# Patient Record
Sex: Male | Born: 1955 | Race: White | Hispanic: No | Marital: Married | Smoking: Current some day smoker
Health system: Southern US, Community
[De-identification: ages and names within clinical notes are randomized; demographics above are authoritative.]

## PROBLEM LIST (undated history)

## (undated) DIAGNOSIS — E559 Vitamin D deficiency, unspecified: Secondary | ICD-10-CM

## (undated) DIAGNOSIS — K219 Gastro-esophageal reflux disease without esophagitis: Secondary | ICD-10-CM

## (undated) DIAGNOSIS — IMO0001 Reserved for inherently not codable concepts without codable children: Secondary | ICD-10-CM

## (undated) HISTORY — PX: OTHER SURGICAL HISTORY: SHX169

## (undated) HISTORY — PX: COLONOSCOPY: SHX174

## (undated) HISTORY — DX: Reserved for inherently not codable concepts without codable children: IMO0001

## (undated) HISTORY — DX: Gastro-esophageal reflux disease without esophagitis: K21.9

## (undated) HISTORY — DX: Vitamin D deficiency, unspecified: E55.9

---

## 2002-10-29 ENCOUNTER — Encounter: Payer: Self-pay | Admitting: Emergency Medicine

## 2002-10-29 ENCOUNTER — Emergency Department (HOSPITAL_COMMUNITY): Admission: EM | Admit: 2002-10-29 | Discharge: 2002-10-29 | Payer: Self-pay | Admitting: Emergency Medicine

## 2009-10-06 ENCOUNTER — Ambulatory Visit (HOSPITAL_COMMUNITY): Admission: RE | Admit: 2009-10-06 | Discharge: 2009-10-06 | Payer: Self-pay | Admitting: Family Medicine

## 2011-06-01 ENCOUNTER — Emergency Department (HOSPITAL_COMMUNITY): Payer: BC Managed Care – PPO

## 2011-06-01 ENCOUNTER — Emergency Department (HOSPITAL_COMMUNITY)
Admission: EM | Admit: 2011-06-01 | Discharge: 2011-06-01 | Disposition: A | Discharge status: Other Acute Inpatient Hospital | Payer: BC Managed Care – PPO | Source: Home / Self Care | Attending: Emergency Medicine | Admitting: Emergency Medicine

## 2011-06-01 ENCOUNTER — Ambulatory Visit (HOSPITAL_COMMUNITY)
Admission: AD | Admit: 2011-06-01 | Discharge: 2011-06-01 | Disposition: A | Discharge status: Home / Self Care | Payer: BC Managed Care – PPO | Source: Ambulatory Visit | Attending: Interventional Cardiology | Admitting: Interventional Cardiology

## 2011-06-01 ENCOUNTER — Ambulatory Visit (HOSPITAL_COMMUNITY): Payer: BC Managed Care – PPO

## 2011-06-01 ENCOUNTER — Encounter (HOSPITAL_COMMUNITY): Payer: Self-pay | Admitting: Radiology

## 2011-06-01 DIAGNOSIS — R0602 Shortness of breath: Secondary | ICD-10-CM | POA: Insufficient documentation

## 2011-06-01 DIAGNOSIS — R079 Chest pain, unspecified: Secondary | ICD-10-CM | POA: Insufficient documentation

## 2011-06-01 DIAGNOSIS — R5381 Other malaise: Secondary | ICD-10-CM | POA: Insufficient documentation

## 2011-06-01 DIAGNOSIS — R5383 Other fatigue: Secondary | ICD-10-CM | POA: Insufficient documentation

## 2011-06-01 LAB — CK TOTAL AND CKMB (NOT AT ARMC)
CK, MB: 2 ng/mL (ref 0.3–4.0)
Relative Index: 1 (ref 0.0–2.5)
Total CK: 208 U/L (ref 7–232)

## 2011-06-01 LAB — DIFFERENTIAL
Basophils Absolute: 0.1 10*3/uL (ref 0.0–0.1)
Basophils Relative: 1 % (ref 0–1)
Eosinophils Absolute: 0.3 10*3/uL (ref 0.0–0.7)
Eosinophils Relative: 4 % (ref 0–5)
Lymphocytes Relative: 34 % (ref 12–46)
Lymphs Abs: 2 10*3/uL (ref 0.7–4.0)
Monocytes Absolute: 0.3 10*3/uL (ref 0.1–1.0)
Monocytes Relative: 6 % (ref 3–12)
Neutro Abs: 3.1 10*3/uL (ref 1.7–7.7)
Neutrophils Relative %: 55 % (ref 43–77)

## 2011-06-01 LAB — CBC
HCT: 44.6 % (ref 39.0–52.0)
Hemoglobin: 15.6 g/dL (ref 13.0–17.0)
MCH: 29.3 pg (ref 26.0–34.0)
MCHC: 35 g/dL (ref 30.0–36.0)
MCV: 83.8 fL (ref 78.0–100.0)
Platelets: 211 10*3/uL (ref 150–400)
RBC: 5.32 MIL/uL (ref 4.22–5.81)
RDW: 13 % (ref 11.5–15.5)
WBC: 5.7 10*3/uL (ref 4.0–10.5)

## 2011-06-01 LAB — BASIC METABOLIC PANEL
BUN: 11 mg/dL (ref 6–23)
CO2: 23 mEq/L (ref 19–32)
Calcium: 9.3 mg/dL (ref 8.4–10.5)
Chloride: 104 mEq/L (ref 96–112)
Creatinine, Ser: 0.89 mg/dL (ref 0.4–1.5)
GFR calc Af Amer: >60 mL/min (ref >60)
GFR calc non Af Amer: >60 mL/min (ref >60)
Glucose, Bld: 118 mg/dL — ABNORMAL HIGH (ref 70–99)
Potassium: 3.7 mEq/L (ref 3.5–5.1)
Sodium: 137 mEq/L (ref 135–145)

## 2011-06-01 LAB — APTT: aPTT: 30 seconds (ref 24–37)

## 2011-06-01 LAB — D-DIMER, QUANTITATIVE: D-Dimer, Quant: 0.86 ug/mL-FEU — ABNORMAL HIGH (ref 0.00–0.48)

## 2011-06-01 LAB — TROPONIN I: Troponin I: <0.3 ng/mL (ref <0.30)

## 2011-06-01 LAB — PROTIME-INR
INR: 1.01 (ref 0.00–1.49)
Prothrombin Time: 13.5 seconds (ref 11.6–15.2)

## 2011-06-01 MED ORDER — IOHEXOL 350 MG/ML SOLN
100.0000 mL | Freq: Once | INTRAVENOUS | Status: AC | PRN
  Administered 2011-06-01: 100 mL via INTRAVENOUS

## 2011-06-02 ENCOUNTER — Inpatient Hospital Stay (HOSPITAL_COMMUNITY)
Admission: EM | Admit: 2011-06-02 | Discharge: 2011-06-03 | DRG: 278 | Disposition: A | Discharge status: Home / Self Care | Payer: BC Managed Care – PPO | Attending: Internal Medicine | Admitting: Internal Medicine

## 2011-06-02 DIAGNOSIS — Z79899 Other long term (current) drug therapy: Secondary | ICD-10-CM

## 2011-06-02 DIAGNOSIS — K219 Gastro-esophageal reflux disease without esophagitis: Secondary | ICD-10-CM | POA: Diagnosis present

## 2011-06-02 DIAGNOSIS — E669 Obesity, unspecified: Secondary | ICD-10-CM | POA: Diagnosis present

## 2011-06-02 DIAGNOSIS — Z7982 Long term (current) use of aspirin: Secondary | ICD-10-CM

## 2011-06-02 DIAGNOSIS — M7989 Other specified soft tissue disorders: Secondary | ICD-10-CM

## 2011-06-02 DIAGNOSIS — L02519 Cutaneous abscess of unspecified hand: Principal | ICD-10-CM | POA: Diagnosis present

## 2011-06-02 LAB — CBC
HCT: 46 % (ref 39.0–52.0)
Hemoglobin: 16.8 g/dL (ref 13.0–17.0)
MCH: 30.8 pg (ref 26.0–34.0)
MCHC: 36.5 g/dL — ABNORMAL HIGH (ref 30.0–36.0)
MCV: 84.2 fL (ref 78.0–100.0)
Platelets: 213 10*3/uL (ref 150–400)
RBC: 5.46 MIL/uL (ref 4.22–5.81)
RDW: 13.1 % (ref 11.5–15.5)
WBC: 11.4 10*3/uL — ABNORMAL HIGH (ref 4.0–10.5)

## 2011-06-02 LAB — URINALYSIS, ROUTINE W REFLEX MICROSCOPIC
Bilirubin Urine: NEGATIVE
Glucose, UA: NEGATIVE mg/dL
Ketones, ur: NEGATIVE mg/dL
Leukocytes, UA: NEGATIVE
Nitrite: NEGATIVE
Protein, ur: NEGATIVE mg/dL
Specific Gravity, Urine: 1.017 (ref 1.005–1.030)
Urobilinogen, UA: 0.2 mg/dL (ref 0.0–1.0)
pH: 6 (ref 5.0–8.0)

## 2011-06-02 LAB — COMPREHENSIVE METABOLIC PANEL
ALT: 19 U/L (ref 0–53)
AST: 17 U/L (ref 0–37)
Albumin: 3.7 g/dL (ref 3.5–5.2)
Alkaline Phosphatase: 63 U/L (ref 39–117)
BUN: 10 mg/dL (ref 6–23)
CO2: 28 mEq/L (ref 19–32)
Calcium: 8.9 mg/dL (ref 8.4–10.5)
Chloride: 102 mEq/L (ref 96–112)
Creatinine, Ser: 1.02 mg/dL (ref 0.4–1.5)
GFR calc Af Amer: >60 mL/min (ref >60)
GFR calc non Af Amer: >60 mL/min (ref >60)
Glucose, Bld: 90 mg/dL (ref 70–99)
Potassium: 3.9 mEq/L (ref 3.5–5.1)
Sodium: 137 mEq/L (ref 135–145)
Total Bilirubin: 0.4 mg/dL (ref 0.3–1.2)
Total Protein: 7.4 g/dL (ref 6.0–8.3)

## 2011-06-02 LAB — DIFFERENTIAL
Basophils Absolute: 0 10*3/uL (ref 0.0–0.1)
Basophils Relative: 0 % (ref 0–1)
Eosinophils Absolute: 0.3 10*3/uL (ref 0.0–0.7)
Eosinophils Relative: 2 % (ref 0–5)
Lymphocytes Relative: 17 % (ref 12–46)
Lymphs Abs: 1.9 10*3/uL (ref 0.7–4.0)
Monocytes Absolute: 0.7 10*3/uL (ref 0.1–1.0)
Monocytes Relative: 6 % (ref 3–12)
Neutro Abs: 8.5 10*3/uL — ABNORMAL HIGH (ref 1.7–7.7)
Neutrophils Relative %: 75 % (ref 43–77)

## 2011-06-02 LAB — PROTIME-INR
INR: 0.94 (ref 0.00–1.49)
Prothrombin Time: 12.8 seconds (ref 11.6–15.2)

## 2011-06-02 LAB — URINE MICROSCOPIC-ADD ON

## 2011-06-02 LAB — SEDIMENTATION RATE: Sed Rate: 3 mm/hr (ref 0–16)

## 2011-06-03 LAB — DIFFERENTIAL
Basophils Absolute: 0 10*3/uL (ref 0.0–0.1)
Basophils Relative: 1 % (ref 0–1)
Eosinophils Absolute: 0.3 10*3/uL (ref 0.0–0.7)
Eosinophils Relative: 4 % (ref 0–5)
Lymphocytes Relative: 23 % (ref 12–46)
Lymphs Abs: 1.5 10*3/uL (ref 0.7–4.0)
Monocytes Absolute: 0.4 10*3/uL (ref 0.1–1.0)
Monocytes Relative: 6 % (ref 3–12)
Neutro Abs: 4.3 10*3/uL (ref 1.7–7.7)
Neutrophils Relative %: 67 % (ref 43–77)

## 2011-06-03 LAB — BASIC METABOLIC PANEL
BUN: 12 mg/dL (ref 6–23)
CO2: 26 mEq/L (ref 19–32)
Calcium: 8.2 mg/dL — ABNORMAL LOW (ref 8.4–10.5)
Chloride: 106 mEq/L (ref 96–112)
Creatinine, Ser: 0.82 mg/dL (ref 0.4–1.5)
GFR calc Af Amer: >60 mL/min (ref >60)
GFR calc non Af Amer: >60 mL/min (ref >60)
Glucose, Bld: 93 mg/dL (ref 70–99)
Potassium: 3.6 mEq/L (ref 3.5–5.1)
Sodium: 140 mEq/L (ref 135–145)

## 2011-06-03 LAB — CBC
HCT: 40.2 % (ref 39.0–52.0)
Hemoglobin: 14.3 g/dL (ref 13.0–17.0)
MCH: 30 pg (ref 26.0–34.0)
MCHC: 35.6 g/dL (ref 30.0–36.0)
MCV: 84.3 fL (ref 78.0–100.0)
Platelets: 198 10*3/uL (ref 150–400)
RBC: 4.77 MIL/uL (ref 4.22–5.81)
RDW: 13.1 % (ref 11.5–15.5)
WBC: 6.5 10*3/uL (ref 4.0–10.5)

## 2011-06-07 NOTE — Discharge Summary (Signed)
NAME:  Shane Nolan, Shane Nolan NO.:  [id number]  MEDICAL RECORD NO.:  [id number]  LOCATION:  5523                         FACILITY:  [facility]  PHYSICIAN:  Thad Ranger, MD       DATE OF BIRTH:  11-01-1956  DATE OF ADMISSION:  06/02/2011 DATE OF DISCHARGE:                        DISCHARGE SUMMARY - REFERRING   PRIMARY CARE PHYSICIAN:  Shane Raider, MD  DISCHARGE DIAGNOSES: 1. Cellulitis of the left hand, improving. 2. Atypical chest pain with negative cardiac catheterization on June 01, 2011. 3. Obesity.  DISCHARGE MEDICATIONS: 1. Doxycycline 100 mg p.o. b.i.d. for 10 days. 2. Aspirin 81 mg p.o. daily. 3. Prilosec 10 mg p.o. daily.  BRIEF HISTORY OF PRESENT ILLNESS:  At the time of admission, Shane Nolan is a 55 year old male, who presented to the emergency department secondary to worsening hand swelling which began in the morning of the admission.  He reported also having streaking of redness into the mid forearm area.  There is also pain associated with the swelling and redness.  The patient had a cardiac catheterization performed a day before secondary to complaints of his recurrent chest pain during the past week, results of which were reported as being normal.  The patient had cannulation through the right radial artery, however, the left hand was swollen and tender in the mid hand area.  The patient had an IV placed in the dorsum of that hand.  The patient was seen in the emergency department by Shane Nolan and per the ER records and admitting note, the patient had an ultrasound performed on the left hand, the results of which were negative for any evidence of phlebitis or thrombosis or abscess.  Soft tissue edema was seen.  The patient was placed on IV antibiotic therapy of clindamycin and referred for medical admission.  PERTINENT LAB AND DIAGNOSTIC DATA:  White count at the time of admission on June 02, 2011, was 11.4, hemoglobin 16.8,  hematocrit 46.0, platelets 213.  CMP was essentially unremarkable.  UA negative for any UTI.  ESR 3 within normal limits.  CBC at the time of the dictation, white count 6.5, hemoglobin 14.3, hematocrit 40.2, platelets 198.  BRIEF HOSPITALIZATION COURSE:  Shane Nolan is a 55 year old male, who was admitted to medical service for cellulitis of the left hand. 1. Cellulitis of the left hand.  The patient was placed on IV     antibiotic therapy of clindamycin.  The hand was also elevated     above the heart to reduce the edema.  The patient was closely     monitored.  At the time of the dictation, cellulitis of the left     hand has considerably improved.  Per the patient's pictures of his     hand on the phone, he had significant erythema with streaking and     swelling, which has improved with IV antibiotic.  During the     hospitalization, the IV was placed on the right hand which also     appeared to be infiltrated, hence the IV was taken out and the     patient was started on p.o. clindamycin.  At this point,  the     patient does not want to stay in the hospital and wants to continue     with antibiotic course outpatient.  During the exam, the range of     motion is slightly decreased, however, per the patient he had     marked improvement from yesterday.  There is no streaking at this     point.  The patient was strongly counseled to see Dr. Lupita Nolan within next 3 days, preferably tomorrow for the followup.  I     briefly discussed over the phone with Infectious Disease, Dr.     Ninetta Nolan who recommended doxycycline for 10 days.  The patient was     also strongly counseled to return back to the ER immediately if he     has any worsening of the swelling or redness, pain or any streaking     on either of the hand.  PHYSICAL EXAMINATION:  VITAL SIGNS:  At the time of my dictation, BP 120/75, temperature 98.0, pulse 64, respirations 17, O2 sats 93% on room air. GENERAL:  The  patient is alert, awake and oriented x3, not in acute distress. HEENT:  Anicteric sclerae.  Pink conjunctivae.  Pupils reactive to light and accommodation.  EOMI. NECK:  Supple.  No lymphopathy.  No JVD. CVS:  S1 and S2 clear. CHEST:  Clear to auscultation bilaterally. ABDOMEN:  Soft, nontender, nondistended. EXTREMITIES:  The patient still has swelling in the left hand, which has considerably improved from yesterday comparing with the pictures on his phone.  He now has some edema in his right hand as well due to the IV infiltration.  However, there is no redness or streaking noted in either of the hand.  The patient also has slight edema in the right foot, 1+. NEURO:  No focal neurological deficits noted.  DISCHARGE INSTRUCTIONS:  Discharge followup with Dr. Lupita Nolan within next 3 days, preferably tomorrow.  This was strongly counseled to the patient and his wife present in the room.  The patient was also instructed to return to the ER immediately if he has any worsening of the swelling, pain, redness, or streaking in either of his hand.   As mentioned above, the patient at this point does not want to stay in the hospital, although I did advise him to stay for observation until a.m.  Time spent 35 minutes counseling the patient as well as the discharge.     Thad Ranger, MD     RR/MEDQ  D:  06/03/2011  T:  06/03/2011  Job:  [id number]  cc:   Shane Nolan, M.D.  Electronically Signed by Andres Labrum Vivek Grealish  on 06/07/2011 03:33:25 PM

## 2011-06-09 LAB — CULTURE, BLOOD (ROUTINE X 2)
Culture  Setup Time: 201206100107
Culture  Setup Time: 201206100108
Culture: NO GROWTH
Culture: NO GROWTH

## 2011-06-13 NOTE — H&P (Addendum)
NAME:  Shane Nolan, TIFFANY NO.:  [id number]  MEDICAL RECORD NO.:  [id number]  LOCATION:  [facility]LED                         FACILITY:  [facility]  PHYSICIAN:  Corky Crafts, MDDATE OF BIRTH:  1956-11-23  DATE OF ADMISSION:  06/01/2011 DATE OF DISCHARGE:                             HISTORY & PHYSICAL   PRIMARY CARE PHYSICIAN:  Lupita Raider, M.D.  PRIMARY CARDIOLOGIST:  Armanda Magic, M.D.  REASON FOR ADMISSION:  Chest discomfort with diaphoresis.  HISTORY OF PRESENT ILLNESS:  The patient is a 55 year old who has had some intermittent chest discomfort over the past 1 to 2 weeks.  He has had some exertional shortness of breath and fatigue that is worse with walking up steps and better with rest.  He saw Dr. Mayford Knife yesterday and was scheduled for a stress test.  While at work today he was sitting at his desk, he had severe sweating and tightness in his chest.  This lasted 15 to 20 minutes.  It was not getting better, so he came to the hospital.  He received some nitroglycerin and has had some relief. Currently he is not having any discomfort.  He feels better.  PAST MEDICAL HISTORY:  GERD.  ALLERGIES:  No known drug allergies.  MEDICATIONS AT HOME:  Aspirin 81 mg daily, Prilosec 10 mg daily.  PAST SURGICAL HISTORY:  Cholecystectomy in 1996.  FAMILY HISTORY:  Father died at age 85.  No early coronary artery disease in the family.  He has one sibling who has had several types of cancer.  SOCIAL HISTORY:  Occasionally smokes cigars, rarely alcohol.  He does drink coffee daily.  Firefighter.  REVIEW OF SYSTEMS:  Significant chest discomfort, shortness of breath, fatigue and sweating as noted above.  No bleeding problems.  No focal weakness.  All other systems negative.  PHYSICAL EXAMINATION:  VITAL SIGNS:  Blood pressure 122/81, heart rate 73. GENERAL:  In general, he is awake, alert, in no apparent distress. HEENT:  Head is normocephalic,  atraumatic. EYES:  Extraocular movements intact. NECK:  No JVD. CARDIOVASCULAR:  Regular rate and rhythm.  S1, S2. LUNGS:  Clear to auscultation bilaterally. ABDOMEN:  Obese. EXTREMITIES:  Showed no edema, 2+ right radial pulse. NEURO:  No focal motor or sensory deficits. SKIN:  No rash. BACK:  No kyphosis. PSYCH:  Normal mood and affect.  LABORATORY DATA:  Lab work shows troponin negative.  ECG shows normal sinus rhythm with no ST-segment changes.  Chest x-ray shows mild cardiomegaly, no active lung disease.  ASSESSMENT:  A 55 year old with symptoms concerning for unstable angina.  PLAN: 1. Continue aspirin.  We discussed the risks and benefits of cardiac     catheterization.  He is willing to proceed.  All questions were     answered.  His wife was present as well. 2. He does need to lose weight for risk factor modification, he also     need to stop smoking cigars 3. Also check D-dimer to rule out any thromboembolic phenomenon.     Corky Crafts, MD     JSV/MEDQ  D:  06/01/2011  T:  06/01/2011  Job:  [id number]  Electronically Signed by Lance Muss  MD on 06/13/2011 12:27:04 PM

## 2011-06-13 NOTE — Cardiovascular Report (Signed)
NAME:  HARGIS, VANDYNE NO.:  [id number]  MEDICAL RECORD NO.:  [id number]  LOCATION:  6531                         FACILITY:  [facility]  PHYSICIAN:  Corky Crafts, MDDATE OF BIRTH:  Aug 27, 1956  DATE OF PROCEDURE:  06/01/2011 DATE OF DISCHARGE:  06/01/2011                           CARDIAC CATHETERIZATION   PRIMARY PHYSICIAN:  Lupita Raider, MD  PRIMARY CARDIOLOGIST:  Armanda Magic, MD  INDICATION FOR PROCEDURE:  Unstable angina.  PROCEDURES PERFORMED:  Left heart catheterization, left ventriculogram, coronary angiogram.  PROCEDURE NARRATIVE:  The risks and benefits of cardiac catheterization were explained to the patient and informed consent was obtained.  He was brought to the cath lab.  He was prepped and draped in the usual sterile fashion.  His right wrist was infiltrated with 1% lidocaine.  A 5-French glide sheath was placed into the right radial artery using the modified Seldinger technique.  A Versacore wire and JR-4 catheter was used to navigate up the right radial into the ascending aorta.  Heparin was given for anticoagulation.  JR-4 was used to engage the right coronary artery.  Subsequently, a JL-3.5 catheter was used to engage the left main and digital angiography was performed in multiple projections using hand injection of contrast.  A pigtail catheter was advanced to the ascending aorta and across the aortic valve under fluoroscopic guidance. Power injection of contrast was performed in the RAO projection to image the left ventricle.  Catheter was pulled back under continuous hemodynamic pressure monitoring.  The catheter was removed and the sheath was pulled.  A TR band was used for hemostasis.  FINDINGS: 1. The right coronary artery is a large dominant vessel.  There are     only minimal irregularities.  There is no significant coronary     artery disease.  The posterior descending artery is a medium-sized     vessel.  The  posterolateral artery is a large vessel without     significant disease. 2. The left main is a large vessel which is widely patent. 3. The left circumflex is a large vessel proximally.  There is a large     OM-1.  The remainder of the circumflex is a small vessel.  The     majority of the lateral wall seems to be supplied by the right     coronary artery branches. 4. The left anterior descending is a large vessel which wraps around     the apex.  There is no angiographically apparent disease proximally     in the midportion.  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     The distal LAD is widely patent.  There is a branching first     diagonal which extends over the lateral wall which appears widely     patent. 5. Left ventriculogram shows normal left ventricular function with an     estimated ejection fraction of 60%.  The ascending aorta appears     normal.  HEMODYNAMICS:  Left ventricular pressure 113/7 with an LVEDP of 11, aortic pressure 112/73 with a mean aortic pressure of 93 mmHg.  IMPRESSION: 1. No significant coronary artery disease. 2. Normal left ventricular function with an  estimated ejection     fraction of 60%. 3. Normal hemodynamics.  RECOMMENDATIONS:  Continue aggressive preventive therapy and risk factor modification, possible discharge later today if his D-dimer is normal.     Corky Crafts, MD     JSV/MEDQ  D:  06/01/2011  T:  06/02/2011  Job:  [id number]  Electronically Signed by Lance Muss MD on 06/13/2011 12:27:16 PM

## 2011-07-16 NOTE — H&P (Signed)
NAME:  Shane Nolan, LUNDEEN NO.:  [id number]  MEDICAL RECORD NO.:  [id number]  LOCATION:  [facility]                         FACILITY:  [facility]  PHYSICIAN:  Della Goo, M.D. DATE OF BIRTH:  1956/08/30  DATE OF ADMISSION:  06/02/2011 DATE OF DISCHARGE:                             HISTORY & PHYSICAL   DATE OF ADMISSION:  June 02, 2011.  PRIMARY CARE PHYSICIAN:  Lupita Raider, MD, [facility].  CHIEF COMPLAINT:  Left hand swelling.  HISTORY OF PRESENT ILLNESS:  This is a 55 year old male who presents to the emergency department secondary to worsening hand swelling which began in the a.m.  He reports also began to have streaking of redness into the mid forearm area.  There is also pain associated with the swelling and redness.  The patient had a cardiac catheterization performed yesterday secondary to complaints of recurrent chest pain during the past week.  Results of which were reported as being normal.The patient had cannulation through the right radial artery.  However, the left hand is swollen and tender in the mid hand area.  The patient had an IV placed in the dorsum of that hand.  The patient was seen in the emergency department and ultrasound was performed of the left hand, results of which were negative for any evidence of phlebitis or thrombosis.  Soft tissue edema was seen.  There was no evidence of a localized abscess.  The patient was placed on IV antibiotic therapy of clindamycin and referred for medical admission.  PAST MEDICAL HISTORY:  Significant for atypical chest pain.  PAST SURGICAL HISTORY:  History of a cholecystectomy.  MEDICATIONS:  At this time include aspirin and Prilosec.  ALLERGIES:  No known drug allergies.  SOCIAL HISTORY:  The patient is married.  He smokes cigars once a week. He drinks alcohol, Ree Kida Daniel's 1-2 drinks a week.  FAMILY HISTORY:  Significant for longevity in his father's family, they live into their 35s and  his mother died at age 56.  No coronary artery disease, hypertension, diabetes, or cancer in his family that he knows of.  REVIEW OF SYSTEMS:  Pertinents as mentioned above.  PHYSICAL EXAMINATION FINDINGS:  GENERAL:  This is an obese 55 year old Caucasian male who is in mild discomfort, but no acute distress. VITAL SIGNS:  Temperature 98.7, blood pressure 145/91, heart rate 73, respirations 20, O2 sat 97%. HEENT:  Normocephalic, atraumatic.  Pupils equally round, reactive to light.  Extraocular movements intact.  Funduscopic benign.  There is no scleral icterus.  Nares are patent bilaterally.  Oropharynx is clear. NECK:  Supple.  Full range of motion.  No thyromegaly, adenopathy, jugular venous distention. CARDIOVASCULAR:  Regular rate and rhythm.  Normal S1 and S2.  No murmurs, gallops, or rubs appreciated. LUNGS:  Clear to auscultation bilaterally.  No rales, rhonchi, or wheezes. ABDOMEN:  Positive bowel sounds, soft, nontender, nondistended.  No hepatosplenomegaly.  Obese. EXTREMITIES:  Without cyanosis, clubbing, or edema except in the left hand which has about 2-3+ edema.  There is mild edema in the mid forearm area and there is also streaking redness.  The points of tenderness in the left hand are in the mid area of the  hand right at the previous IV site.  The radial and ulnar pulses are intact.  There is redness streaking from the thenar aspect of the left hand into the mid forearm. The patient also has slight edema in the right foot 1+. NEUROLOGIC:  Nonfocal.  LABORATORY STUDIES:  White blood cell count 11.4, hemoglobin 16.8, hematocrit 46.0, MCV 84.2, platelets 213, neutrophils 75%, lymphocytes 17%.  Sodium 137, potassium 3.9, chloride 102, carbon dioxide 28, BUN 10, creatinine 1.02, and glucose 90.  ProTime 12.8, INR 0.94. Urinalysis, trace hemoglobin.  Urine microscopic with 0-2 red blood cells.  ASSESSMENT:  A 55 year old male being admitted with: 1. Cellulitis of  left hand. 2. Atypical chest pain with a negative cardiac catheterization. 3. Gastroesophageal reflux disease. 4. Obesity.  PLAN:  The patient will be admitted to Medical Service bed.  He will continue on IV antibiotic therapy of clindamycin at this time.  The hand will also be elevated above the heart to help reduce the edema and the patient will be monitored for further changes and development of a possible compartment syndrome.  The patient's regular medications will be further verified and the patient will be placed on DVT prophylaxis. The patient is a full code.  Further workup will ensue pending results of the patient's clinical course.     Della Goo, M.D.     HJ/MEDQ  D:  06/02/2011  T:  06/02/2011  Job:  [id number]  Electronically Signed by Della Goo M.D. on 07/16/2011 10:58:03 AM

## 2012-05-01 IMAGING — CT CT ANGIO CHEST
2 of 6 series · 19 of 46 positions shown · IV contrast (APPLIED)
Comparison: None

CLINICAL DATA: Chest pain and elevated D-dimer.

CT ANGIOGRAPHY CHEST WITH CONTRAST
TECHNIQUE: Multidetector CT imaging of the chest was performed
using the standard protocol during bolus administration of
intravenous contrast.  Multiplanar CT image reconstructions
including MIPs were obtained to evaluate the vascular anatomy.
Contrast:  100 ml intravenous Omnipaque 350

[Series 8: pulm embolism 1.0 b25f thin · axial · 0.82mm/px · z∈[+1144,+1417]mm · 16 of 301 slices shown]
[im 14/301  lung]
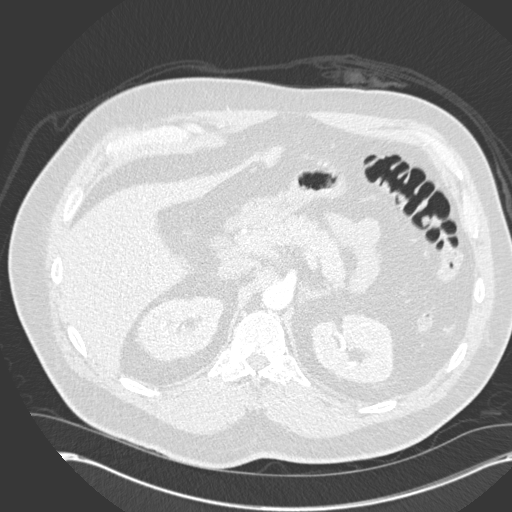
[im 40/301  soft-tissue]
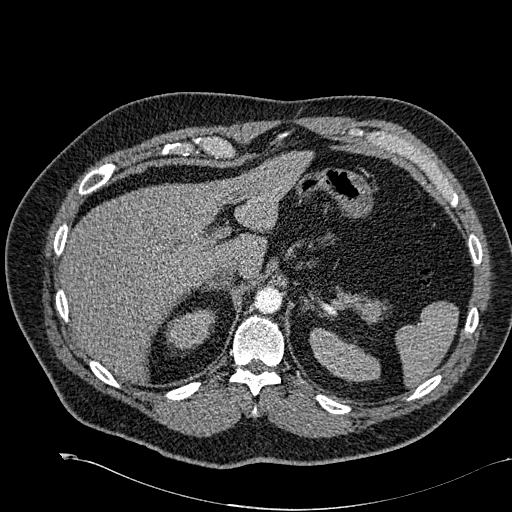
[im 53/301  lung]
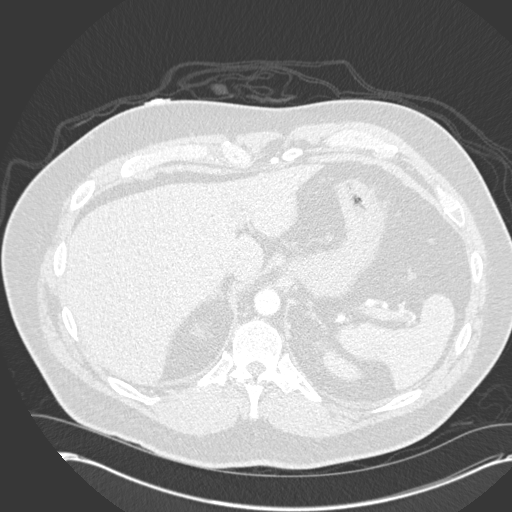
[im 66/301  soft-tissue]
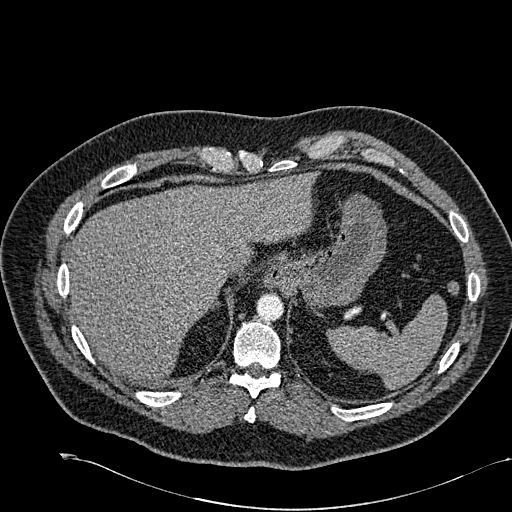
[im 92/301  lung]
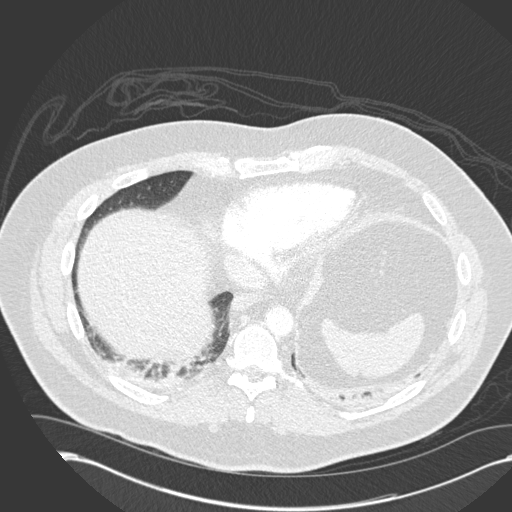
[im 105/301  soft-tissue]
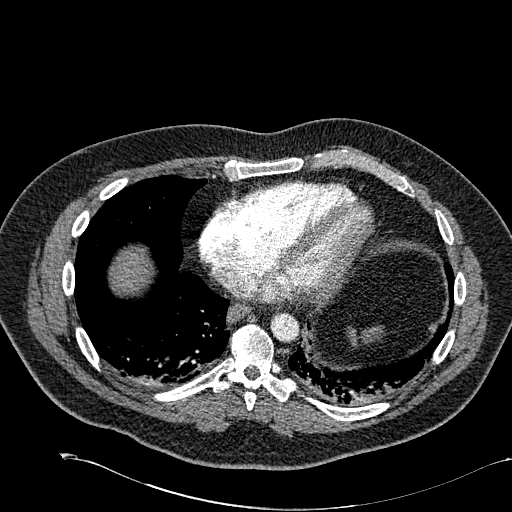
[im 118/301  lung]
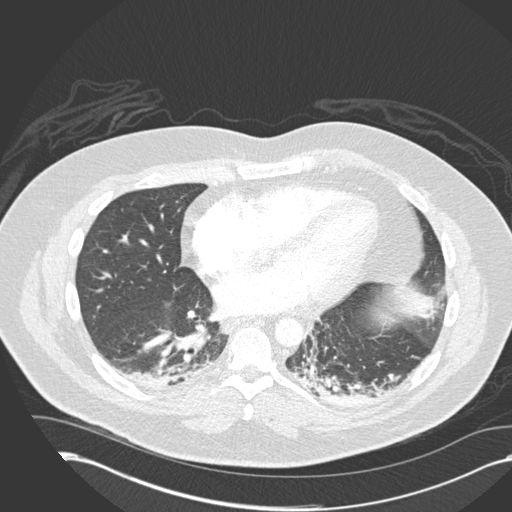
[im 144/301  soft-tissue]
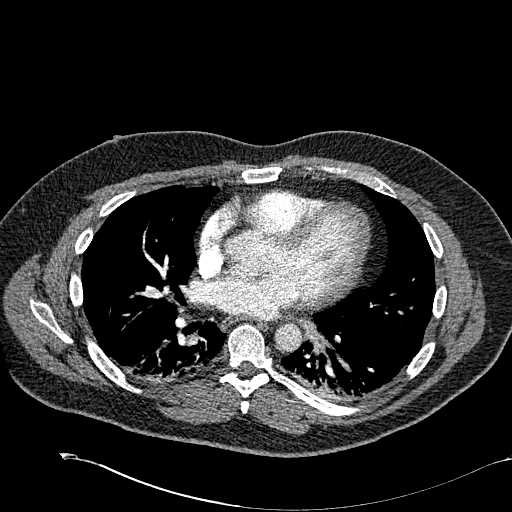
[im 157/301  lung]
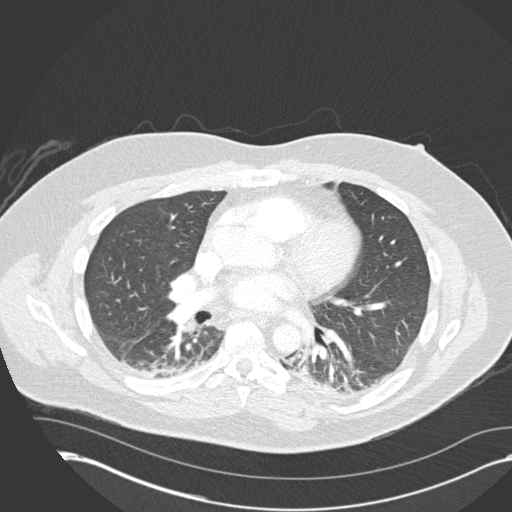
[im 183/301  soft-tissue]
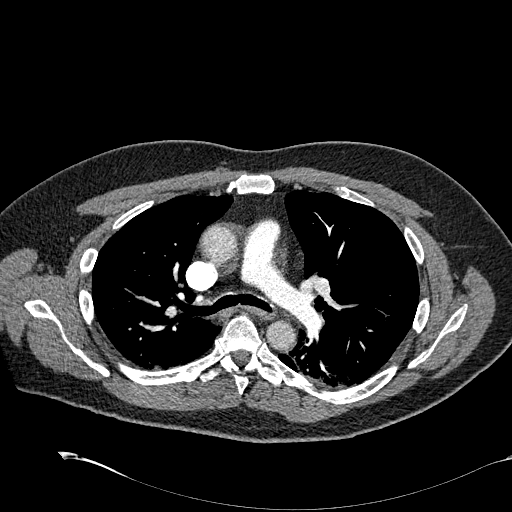
[im 196/301  lung]
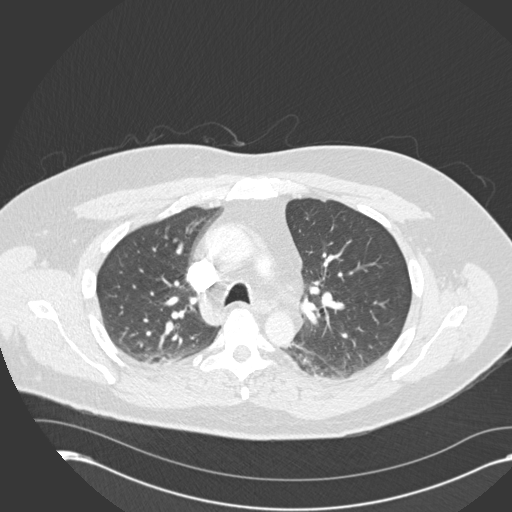
[im 209/301  soft-tissue]
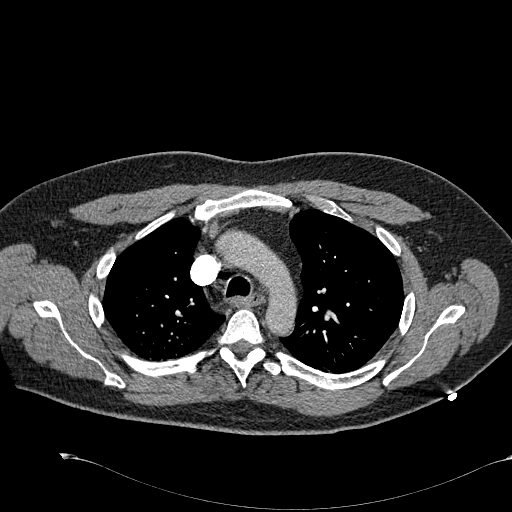
[im 235/301  lung]
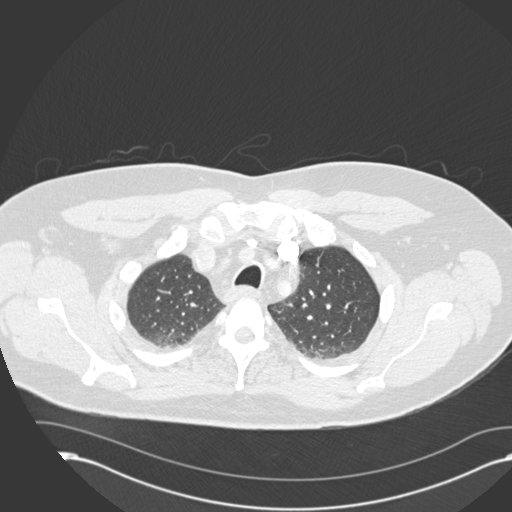
[im 248/301  soft-tissue]
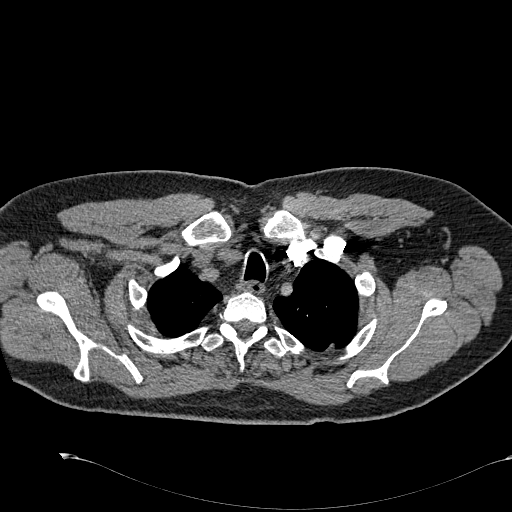
[im 261/301  lung]
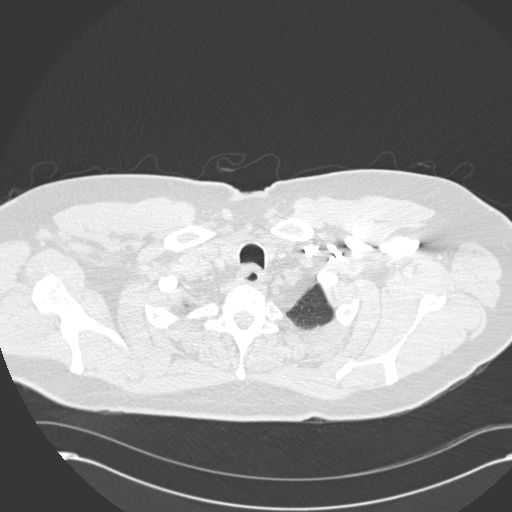
[im 287/301  soft-tissue]
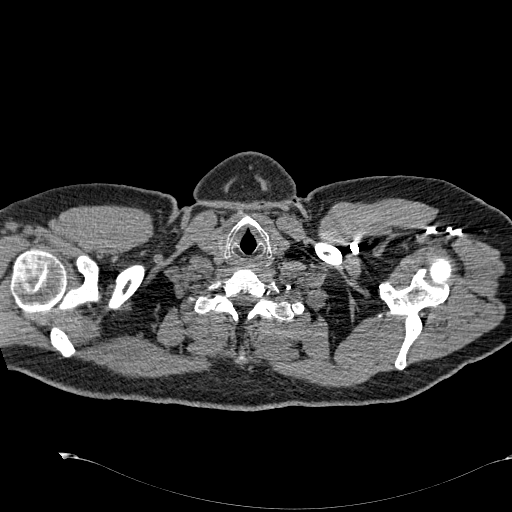

[Series 604: coronal · coronal · 0.82mm/px · 3 of 126 slices shown]
[im 32/126  soft-tissue]
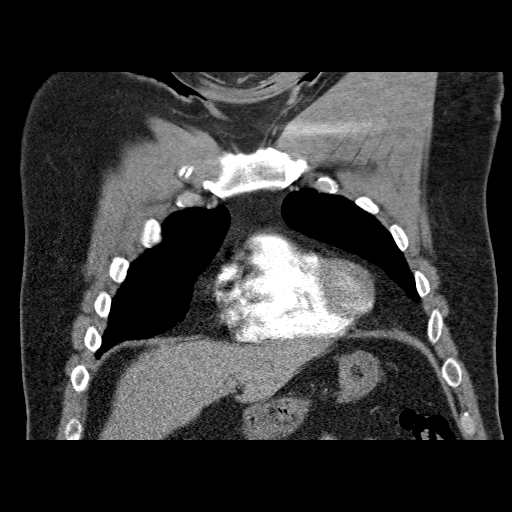
[im 63/126  soft-tissue]
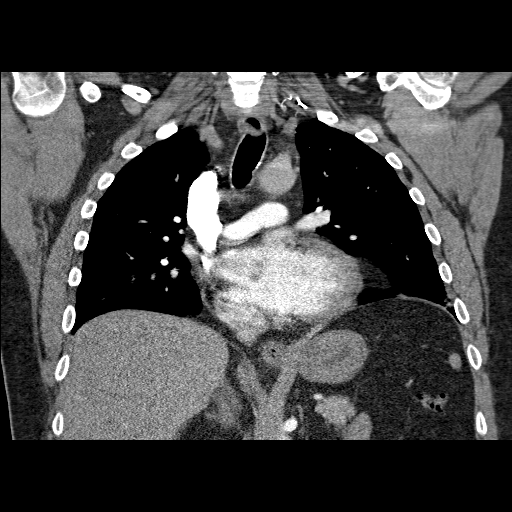
[im 94/126  soft-tissue]
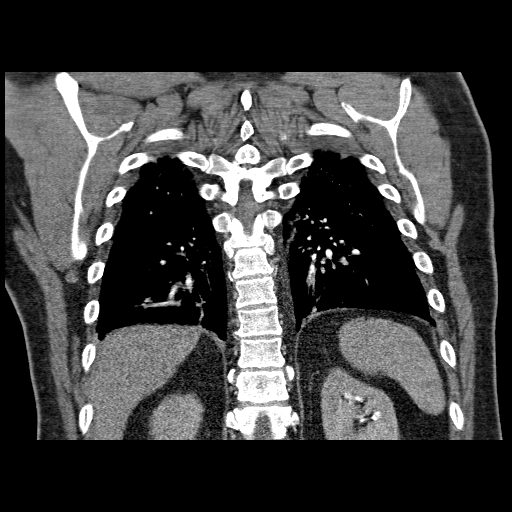

[19 of 46 positions shown; findings below may reference images not displayed]

FINDINGS: This is a technically adequate study but motion artifact
in the lower lungs slightly limit sensitivity.

No pulmonary emboli are identified.
There is no evidence of thoracic aortic aneurysm.
Mild cardiomegaly is identified.
There are no pleural or pericardial effusions.
No enlarged lymph nodes identified in the axillary, mediastinal or
hilar regions.

Moderate atelectasis within both lower lobes noted.
There is no evidence of airspace disease, nodule, mass or
endobronchial/endotracheal lesions.
No acute or suspicious bony abnormalities are identified.

Review of the MIP images confirms the above findings.
IMPRESSION: No evidence of pulmonary emboli or thoracic aortic aneurysm.

Moderate bilateral lower lobe atelectasis.

Mild cardiomegaly

## 2014-01-23 ENCOUNTER — Encounter: Payer: Self-pay | Admitting: Cardiology

## 2016-05-29 DIAGNOSIS — E559 Vitamin D deficiency, unspecified: Secondary | ICD-10-CM | POA: Diagnosis not present

## 2016-05-29 DIAGNOSIS — Z1211 Encounter for screening for malignant neoplasm of colon: Secondary | ICD-10-CM | POA: Diagnosis not present

## 2016-05-29 DIAGNOSIS — Z23 Encounter for immunization: Secondary | ICD-10-CM | POA: Diagnosis not present

## 2016-05-29 DIAGNOSIS — E669 Obesity, unspecified: Secondary | ICD-10-CM | POA: Diagnosis not present

## 2016-05-29 DIAGNOSIS — Z0000 Encounter for general adult medical examination without abnormal findings: Secondary | ICD-10-CM | POA: Diagnosis not present

## 2016-05-29 DIAGNOSIS — Z125 Encounter for screening for malignant neoplasm of prostate: Secondary | ICD-10-CM | POA: Diagnosis not present

## 2016-07-25 DIAGNOSIS — E559 Vitamin D deficiency, unspecified: Secondary | ICD-10-CM | POA: Diagnosis not present

## 2016-08-08 DIAGNOSIS — M25561 Pain in right knee: Secondary | ICD-10-CM | POA: Diagnosis not present

## 2016-08-21 DIAGNOSIS — K5730 Diverticulosis of large intestine without perforation or abscess without bleeding: Secondary | ICD-10-CM | POA: Diagnosis not present

## 2016-08-21 DIAGNOSIS — Z1211 Encounter for screening for malignant neoplasm of colon: Secondary | ICD-10-CM | POA: Diagnosis not present

## 2017-07-04 DIAGNOSIS — Z0000 Encounter for general adult medical examination without abnormal findings: Secondary | ICD-10-CM | POA: Diagnosis not present

## 2017-07-04 DIAGNOSIS — Z131 Encounter for screening for diabetes mellitus: Secondary | ICD-10-CM | POA: Diagnosis not present

## 2017-07-04 DIAGNOSIS — E559 Vitamin D deficiency, unspecified: Secondary | ICD-10-CM | POA: Diagnosis not present

## 2017-07-04 DIAGNOSIS — Z125 Encounter for screening for malignant neoplasm of prostate: Secondary | ICD-10-CM | POA: Diagnosis not present

## 2017-07-04 DIAGNOSIS — Z13220 Encounter for screening for lipoid disorders: Secondary | ICD-10-CM | POA: Diagnosis not present

## 2018-01-13 DIAGNOSIS — E559 Vitamin D deficiency, unspecified: Secondary | ICD-10-CM | POA: Diagnosis not present

## 2018-04-21 DIAGNOSIS — E559 Vitamin D deficiency, unspecified: Secondary | ICD-10-CM | POA: Diagnosis not present

## 2018-06-19 DIAGNOSIS — E559 Vitamin D deficiency, unspecified: Secondary | ICD-10-CM | POA: Diagnosis not present

## 2018-07-08 DIAGNOSIS — Z125 Encounter for screening for malignant neoplasm of prostate: Secondary | ICD-10-CM | POA: Diagnosis not present

## 2018-07-08 DIAGNOSIS — Z0000 Encounter for general adult medical examination without abnormal findings: Secondary | ICD-10-CM | POA: Diagnosis not present

## 2018-07-08 DIAGNOSIS — E669 Obesity, unspecified: Secondary | ICD-10-CM | POA: Diagnosis not present

## 2018-07-08 DIAGNOSIS — E559 Vitamin D deficiency, unspecified: Secondary | ICD-10-CM | POA: Diagnosis not present

## 2018-10-07 DIAGNOSIS — E559 Vitamin D deficiency, unspecified: Secondary | ICD-10-CM | POA: Diagnosis not present

## 2019-07-27 DIAGNOSIS — Z125 Encounter for screening for malignant neoplasm of prostate: Secondary | ICD-10-CM | POA: Diagnosis not present

## 2019-07-27 DIAGNOSIS — E669 Obesity, unspecified: Secondary | ICD-10-CM | POA: Diagnosis not present

## 2019-07-27 DIAGNOSIS — E559 Vitamin D deficiency, unspecified: Secondary | ICD-10-CM | POA: Diagnosis not present

## 2019-07-29 DIAGNOSIS — Z0000 Encounter for general adult medical examination without abnormal findings: Secondary | ICD-10-CM | POA: Diagnosis not present

## 2019-12-03 ENCOUNTER — Other Ambulatory Visit: Payer: Self-pay

## 2019-12-03 DIAGNOSIS — Z20822 Contact with and (suspected) exposure to covid-19: Secondary | ICD-10-CM

## 2019-12-05 LAB — NOVEL CORONAVIRUS, NAA: SARS-CoV-2, NAA: NOT DETECTED

## 2020-03-19 ENCOUNTER — Ambulatory Visit: Payer: Self-pay | Attending: Internal Medicine

## 2020-03-19 DIAGNOSIS — Z23 Encounter for immunization: Secondary | ICD-10-CM

## 2020-03-19 NOTE — Progress Notes (Signed)
   Covid-19 Vaccination Clinic  Name:  Shane Nolan    MRN: [id number] DOB: [birth year]  03/19/2020  [name] was observed post Covid-19 immunization for 15 minutes without incident. He was provided with Vaccine Information Sheet and instruction to access the V-Safe system.   Mr. Deal was instructed to call 911 with any severe reactions post vaccine: Marland Kitchen Difficulty breathing  . Swelling of face and throat  . A fast heartbeat  . A bad rash all over body  . Dizziness and weakness   Immunizations Administered    Name Date Dose VIS Date Route   Pfizer COVID-19 Vaccine 03/19/2020 10:08 AM 0.3 mL 12/04/2019 Intramuscular   Manufacturer: [employer], [employer]   Lot: [id number]   NDC: [address]-5

## 2020-04-12 ENCOUNTER — Ambulatory Visit: Payer: Self-pay | Attending: Internal Medicine

## 2020-04-12 DIAGNOSIS — Z23 Encounter for immunization: Secondary | ICD-10-CM

## 2020-04-12 NOTE — Progress Notes (Signed)
   Covid-19 Vaccination Clinic  Name:  Laith Antonelli    MRN: [id number] DOB: [birth year]  04/12/2020  Mr. Eng was observed post Covid-19 immunization for 15 minutes without incident. He was provided with Vaccine Information Sheet and instruction to access the V-Safe system.   Mr. Chichester was instructed to call 911 with any severe reactions post vaccine: Marland Kitchen Difficulty breathing  . Swelling of face and throat  . A fast heartbeat  . A bad rash all over body  . Dizziness and weakness   Immunizations Administered    Name Date Dose VIS Date Route   Pfizer COVID-19 Vaccine 04/12/2020 11:57 AM 0.3 mL 02/17/2019 Intramuscular   Manufacturer: [employer], [employer]   Lot: [id number]   NDC: [address]-2

## 2020-08-16 DIAGNOSIS — E559 Vitamin D deficiency, unspecified: Secondary | ICD-10-CM | POA: Diagnosis not present

## 2020-08-16 DIAGNOSIS — Z0000 Encounter for general adult medical examination without abnormal findings: Secondary | ICD-10-CM | POA: Diagnosis not present

## 2020-08-16 DIAGNOSIS — Z131 Encounter for screening for diabetes mellitus: Secondary | ICD-10-CM | POA: Diagnosis not present

## 2020-08-16 DIAGNOSIS — Z125 Encounter for screening for malignant neoplasm of prostate: Secondary | ICD-10-CM | POA: Diagnosis not present

## 2020-08-16 DIAGNOSIS — Z13220 Encounter for screening for lipoid disorders: Secondary | ICD-10-CM | POA: Diagnosis not present

## 2021-08-18 DIAGNOSIS — Z0000 Encounter for general adult medical examination without abnormal findings: Secondary | ICD-10-CM | POA: Diagnosis not present

## 2021-08-18 DIAGNOSIS — Z13220 Encounter for screening for lipoid disorders: Secondary | ICD-10-CM | POA: Diagnosis not present

## 2021-08-18 DIAGNOSIS — E559 Vitamin D deficiency, unspecified: Secondary | ICD-10-CM | POA: Diagnosis not present

## 2021-08-18 DIAGNOSIS — Z131 Encounter for screening for diabetes mellitus: Secondary | ICD-10-CM | POA: Diagnosis not present

## 2021-08-18 DIAGNOSIS — Z125 Encounter for screening for malignant neoplasm of prostate: Secondary | ICD-10-CM | POA: Diagnosis not present

## 2021-10-05 DIAGNOSIS — L918 Other hypertrophic disorders of the skin: Secondary | ICD-10-CM | POA: Diagnosis not present

## 2021-10-05 DIAGNOSIS — L298 Other pruritus: Secondary | ICD-10-CM | POA: Diagnosis not present

## 2021-10-05 DIAGNOSIS — Z789 Other specified health status: Secondary | ICD-10-CM | POA: Diagnosis not present

## 2021-10-05 DIAGNOSIS — L538 Other specified erythematous conditions: Secondary | ICD-10-CM | POA: Diagnosis not present

## 2021-10-05 DIAGNOSIS — L820 Inflamed seborrheic keratosis: Secondary | ICD-10-CM | POA: Diagnosis not present

## 2022-01-03 DIAGNOSIS — J069 Acute upper respiratory infection, unspecified: Secondary | ICD-10-CM | POA: Diagnosis not present

## 2022-01-03 DIAGNOSIS — R0989 Other specified symptoms and signs involving the circulatory and respiratory systems: Secondary | ICD-10-CM | POA: Diagnosis not present

## 2022-01-03 DIAGNOSIS — R059 Cough, unspecified: Secondary | ICD-10-CM | POA: Diagnosis not present

## 2022-01-03 DIAGNOSIS — R0981 Nasal congestion: Secondary | ICD-10-CM | POA: Diagnosis not present

## 2022-02-05 DIAGNOSIS — U071 COVID-19: Secondary | ICD-10-CM | POA: Diagnosis not present

## 2022-03-01 DIAGNOSIS — E782 Mixed hyperlipidemia: Secondary | ICD-10-CM | POA: Diagnosis not present

## 2022-05-07 DIAGNOSIS — B349 Viral infection, unspecified: Secondary | ICD-10-CM | POA: Diagnosis not present

## 2022-05-07 DIAGNOSIS — R112 Nausea with vomiting, unspecified: Secondary | ICD-10-CM | POA: Diagnosis not present

## 2022-10-02 DIAGNOSIS — Z0000 Encounter for general adult medical examination without abnormal findings: Secondary | ICD-10-CM | POA: Diagnosis not present

## 2022-10-02 DIAGNOSIS — Z125 Encounter for screening for malignant neoplasm of prostate: Secondary | ICD-10-CM | POA: Diagnosis not present

## 2022-10-02 DIAGNOSIS — E782 Mixed hyperlipidemia: Secondary | ICD-10-CM | POA: Diagnosis not present

## 2022-10-02 DIAGNOSIS — E559 Vitamin D deficiency, unspecified: Secondary | ICD-10-CM | POA: Diagnosis not present

## 2022-10-02 DIAGNOSIS — Z23 Encounter for immunization: Secondary | ICD-10-CM | POA: Diagnosis not present

## 2023-06-12 DIAGNOSIS — M25511 Pain in right shoulder: Secondary | ICD-10-CM | POA: Diagnosis not present

## 2023-06-12 DIAGNOSIS — M4722 Other spondylosis with radiculopathy, cervical region: Secondary | ICD-10-CM | POA: Diagnosis not present

## 2023-06-13 ENCOUNTER — Emergency Department (HOSPITAL_BASED_OUTPATIENT_CLINIC_OR_DEPARTMENT_OTHER)
Admission: EM | Admit: 2023-06-13 | Discharge: 2023-06-13 | Disposition: A | Discharge status: Home / Self Care | Payer: BC Managed Care – PPO | Attending: Emergency Medicine | Admitting: Emergency Medicine

## 2023-06-13 ENCOUNTER — Encounter (HOSPITAL_BASED_OUTPATIENT_CLINIC_OR_DEPARTMENT_OTHER): Payer: Self-pay | Admitting: Emergency Medicine

## 2023-06-13 ENCOUNTER — Other Ambulatory Visit: Payer: Self-pay

## 2023-06-13 DIAGNOSIS — R220 Localized swelling, mass and lump, head: Secondary | ICD-10-CM

## 2023-06-13 DIAGNOSIS — R600 Localized edema: Secondary | ICD-10-CM | POA: Insufficient documentation

## 2023-06-13 DIAGNOSIS — T7840XA Allergy, unspecified, initial encounter: Secondary | ICD-10-CM | POA: Insufficient documentation

## 2023-06-13 MED ORDER — DIPHENHYDRAMINE HCL 25 MG PO TABS: 25.0000 mg | ORAL_TABLET | Freq: Four times a day (QID) | ORAL | 0 refills | Status: AC | PRN

## 2023-06-13 MED ORDER — SODIUM CHLORIDE 0.9 % IV BOLUS
1000.0000 mL | Freq: Once | INTRAVENOUS | Status: AC
  Administered 2023-06-13: 1000 mL via INTRAVENOUS

## 2023-06-13 MED ORDER — EPINEPHRINE 0.3 MG/0.3ML IJ SOAJ: 0.3000 mg | INTRAMUSCULAR | 0 refills | Status: AC | PRN

## 2023-06-13 MED ORDER — METHYLPREDNISOLONE SODIUM SUCC 125 MG IJ SOLR
125.0000 mg | Freq: Once | INTRAMUSCULAR | Status: AC
  Administered 2023-06-13: 125 mg via INTRAVENOUS
  Filled 2023-06-13: qty 2

## 2023-06-13 MED ORDER — DIPHENHYDRAMINE HCL 50 MG/ML IJ SOLN
25.0000 mg | Freq: Once | INTRAMUSCULAR | Status: AC
  Administered 2023-06-13: 25 mg via INTRAVENOUS
  Filled 2023-06-13: qty 1

## 2023-06-13 MED ORDER — PREDNISONE 20 MG PO TABS: 40.0000 mg | ORAL_TABLET | Freq: Every day | ORAL | 0 refills | Status: AC

## 2023-06-13 MED ORDER — FAMOTIDINE 20 MG PO TABS: 20.0000 mg | ORAL_TABLET | Freq: Every day | ORAL | 0 refills | Status: AC

## 2023-06-13 MED ORDER — FAMOTIDINE IN NACL 20-0.9 MG/50ML-% IV SOLN
20.0000 mg | Freq: Once | INTRAVENOUS | Status: AC
  Administered 2023-06-13: 20 mg via INTRAVENOUS
  Filled 2023-06-13: qty 50

## 2023-06-13 NOTE — ED Triage Notes (Signed)
Pt arrives pov, steady gait, endorses lower lip and facial swelling after eating at [employer] today. Pt denies shob. Swelling noted. 50 mg benadryl pta

## 2023-06-13 NOTE — ED Provider Notes (Signed)
[facility] AT [facility] Provider Note   CSN: [id number] Arrival date & time: 06/13/23  1524     History  Chief Complaint  Patient presents with   Allergic Reaction    Shane Nolan is a 67 y.o. male, pertinent past medical history, who presents to the ED secondary to lower lip and facial swelling after eating [employer] today.  He states he ate a chicken sandwich today, with mustard and ketchup, and noticed that shortly afterwards he started having some swelling of his lower lip, and lower face.  He denies any shortness of breath rash, chest pain.  States he has only had this happen when he had shrimp, and he does not think there is any in the sandwich.  Took 50 mg of Benadryl prior to arrival, went to urgent care first and was sent here.  This all occurred around noon today.    Home Medications Prior to Admission medications   Medication Sig Start Date End Date Taking? Authorizing Provider  diphenhydrAMINE (BENADRYL) 25 MG tablet Take 1 tablet (25 mg total) by mouth every 6 (six) hours as needed. 06/13/23  Yes Minsa Weddington L, PA  EPINEPHrine 0.3 mg/0.3 mL IJ SOAJ injection Inject 0.3 mg into the muscle as needed for anaphylaxis. 06/13/23  Yes Beatrice Sehgal L, PA  famotidine (PEPCID) 20 MG tablet Take 1 tablet (20 mg total) by mouth daily. 06/13/23  Yes Avery Eustice L, PA  predniSONE (DELTASONE) 20 MG tablet Take 2 tablets (40 mg total) by mouth daily. 06/13/23  Yes Kathy Wares L, PA  ergocalciferol (VITAMIN D2) 50000 UNITS capsule Take 50,000 Units by mouth once a week.    [provider]      Allergies    Shrimp extract    Review of Systems   Review of Systems  HENT:  Positive for facial swelling.   Respiratory:  Negative for shortness of breath.     Physical Exam Updated Vital Signs BP (!) 161/84 (BP Location: Left Arm)   Pulse 67   Temp 97.8 F (36.6 C)   Resp 16   Ht 5\' 7"  (1.702 m)   Wt 113.4 kg   SpO2 97%   BMI 39.16  kg/m  Physical Exam Vitals and nursing note reviewed.  Constitutional:      General: He is not in acute distress.    Appearance: He is well-developed.  HENT:     Head: Normocephalic and atraumatic.     Mouth/Throat:     Comments: No tongue swelling, but there is swelling of the lower lip, with edema of the lower face below the lip. Eyes:     Conjunctiva/sclera: Conjunctivae normal.  Cardiovascular:     Rate and Rhythm: Normal rate and regular rhythm.     Heart sounds: No murmur heard. Pulmonary:     Effort: Pulmonary effort is normal. No respiratory distress.     Breath sounds: Normal breath sounds.  Abdominal:     Palpations: Abdomen is soft.     Tenderness: There is no abdominal tenderness.  Musculoskeletal:        General: No swelling.     Cervical back: Neck supple.  Skin:    General: Skin is warm and dry.     Capillary Refill: Capillary refill takes less than 2 seconds.     Comments: No urticaria or other rashes noted  Neurological:     Mental Status: He is alert.  Psychiatric:        Mood  and Affect: Mood normal.     ED Results / Procedures / Treatments   Labs (all labs ordered are listed, but only abnormal results are displayed) Labs Reviewed - No data to display  EKG None  Radiology No results found.  Procedures Procedures    Medications Ordered in ED Medications  methylPREDNISolone sodium succinate (SOLU-MEDROL) 125 mg/2 mL injection 125 mg (125 mg Intravenous Given 06/13/23 1649)  diphenhydrAMINE (BENADRYL) injection 25 mg (25 mg Intravenous Given 06/13/23 1650)  sodium chloride 0.9 % bolus 1,000 mL (0 mLs Intravenous Stopped 06/13/23 1915)  famotidine (PEPCID) IVPB 20 mg premix (0 mg Intravenous Stopped 06/13/23 1721)    ED Course/ Medical Decision Making/ A&P                             Medical Decision Making Patient is a 66 year old male, here for allergic reaction, occurred after eating [employer] sandwich.  He has no wheezing, shortness of  breath, chest pain, rashes.  He just has swelling of his lower lip, as well as in his lower face.  He has no uvular deviation, or swelling of his oropharynx.  We will give him some more Benadryl, Solu-Medrol, famotidine, as well as IV fluids and monitor for improvement.  After reeval, patient swelling has decreased by about 50%, he is feeling much better, and ready to go home.  I monitor him for at least 3 hours, and there was no recurrence of symptoms.  I discharged him with an EpiPen, with strict return precautions as well as prednisone, famotidine, and instructed him to use Benadryl as needed.  Patient voiced understanding discharged home.  Risk OTC drugs. Prescription drug management.    Final Clinical Impression(s) / ED Diagnoses Final diagnoses:  Allergic reaction, initial encounter  Facial swelling    Rx / DC Orders ED Discharge Orders          Ordered    EPINEPHrine 0.3 mg/0.3 mL IJ SOAJ injection  As needed        06/13/23 1838    predniSONE (DELTASONE) 20 MG tablet  Daily        06/13/23 1838    famotidine (PEPCID) 20 MG tablet  Daily        06/13/23 1838    diphenhydrAMINE (BENADRYL) 25 MG tablet  Every 6 hours PRN        06/13/23 1838              Pete Pelt, PA 06/13/23 1948    Virgina Norfolk, DO 06/13/23 2102

## 2023-06-13 NOTE — Discharge Instructions (Signed)
Please follow-up with your PCP. It is likely an allergic reaction to the [employer] you had today. Return to the ED if you have shortness of breath or worsening facial swelling. I have prescribed you an Epipen if you need it--if you have severe shortness of breath or feel like your throat is closing again when exposed to your allergy again. I would recommend avoiding chickfila until you have allergy testing done.

## 2023-08-21 DIAGNOSIS — J3089 Other allergic rhinitis: Secondary | ICD-10-CM | POA: Diagnosis not present

## 2023-10-15 DIAGNOSIS — Z23 Encounter for immunization: Secondary | ICD-10-CM | POA: Diagnosis not present

## 2023-10-15 DIAGNOSIS — Z0000 Encounter for general adult medical examination without abnormal findings: Secondary | ICD-10-CM | POA: Diagnosis not present

## 2023-10-15 DIAGNOSIS — Z125 Encounter for screening for malignant neoplasm of prostate: Secondary | ICD-10-CM | POA: Diagnosis not present

## 2023-10-15 DIAGNOSIS — E559 Vitamin D deficiency, unspecified: Secondary | ICD-10-CM | POA: Diagnosis not present

## 2023-10-15 DIAGNOSIS — E782 Mixed hyperlipidemia: Secondary | ICD-10-CM | POA: Diagnosis not present

## 2024-01-16 DIAGNOSIS — R972 Elevated prostate specific antigen [PSA]: Secondary | ICD-10-CM | POA: Diagnosis not present

## 2024-01-16 DIAGNOSIS — Z23 Encounter for immunization: Secondary | ICD-10-CM | POA: Diagnosis not present

## 2024-10-16 DIAGNOSIS — E559 Vitamin D deficiency, unspecified: Secondary | ICD-10-CM | POA: Diagnosis not present

## 2024-10-16 DIAGNOSIS — Z0000 Encounter for general adult medical examination without abnormal findings: Secondary | ICD-10-CM | POA: Diagnosis not present

## 2024-10-16 DIAGNOSIS — Z125 Encounter for screening for malignant neoplasm of prostate: Secondary | ICD-10-CM | POA: Diagnosis not present

## 2024-10-16 DIAGNOSIS — Z23 Encounter for immunization: Secondary | ICD-10-CM | POA: Diagnosis not present

## 2024-10-16 DIAGNOSIS — E782 Mixed hyperlipidemia: Secondary | ICD-10-CM | POA: Diagnosis not present

## 2024-10-16 DIAGNOSIS — Z131 Encounter for screening for diabetes mellitus: Secondary | ICD-10-CM | POA: Diagnosis not present

## 2024-10-22 DIAGNOSIS — R0681 Apnea, not elsewhere classified: Secondary | ICD-10-CM | POA: Diagnosis not present

## 2024-12-08 DIAGNOSIS — G4733 Obstructive sleep apnea (adult) (pediatric): Secondary | ICD-10-CM | POA: Diagnosis not present

## 2024-12-21 DIAGNOSIS — G4733 Obstructive sleep apnea (adult) (pediatric): Secondary | ICD-10-CM | POA: Diagnosis not present
